# Patient Record
Sex: Female | Born: 1950 | Race: White | Hispanic: No | Marital: Married | Smoking: Former smoker
Health system: Southern US, Community
[De-identification: ages and names within clinical notes are randomized; demographics above are authoritative.]

## PROBLEM LIST (undated history)

## (undated) DIAGNOSIS — C50919 Malignant neoplasm of unspecified site of unspecified female breast: Secondary | ICD-10-CM

## (undated) DIAGNOSIS — J309 Allergic rhinitis, unspecified: Secondary | ICD-10-CM

## (undated) DIAGNOSIS — E119 Type 2 diabetes mellitus without complications: Secondary | ICD-10-CM

## (undated) DIAGNOSIS — K219 Gastro-esophageal reflux disease without esophagitis: Secondary | ICD-10-CM

## (undated) DIAGNOSIS — H1010 Acute atopic conjunctivitis, unspecified eye: Secondary | ICD-10-CM

## (undated) DIAGNOSIS — J45909 Unspecified asthma, uncomplicated: Secondary | ICD-10-CM

## (undated) DIAGNOSIS — C73 Malignant neoplasm of thyroid gland: Secondary | ICD-10-CM

## (undated) HISTORY — PX: PARTIAL HYSTERECTOMY: SHX80

## (undated) HISTORY — DX: Type 2 diabetes mellitus without complications: E11.9

## (undated) HISTORY — DX: Gastro-esophageal reflux disease without esophagitis: K21.9

## (undated) HISTORY — DX: Malignant neoplasm of thyroid gland: C73

## (undated) HISTORY — DX: Malignant neoplasm of unspecified site of unspecified female breast: C50.919

## (undated) HISTORY — DX: Acute atopic conjunctivitis, unspecified eye: H10.10

## (undated) HISTORY — DX: Unspecified asthma, uncomplicated: J45.909

## (undated) HISTORY — PX: THYROIDECTOMY: SHX17

## (undated) HISTORY — DX: Allergic rhinitis, unspecified: J30.9

## (undated) HISTORY — PX: BREAST LUMPECTOMY: SHX2

## (undated) HISTORY — PX: BRAIN TUMOR EXCISION: SHX577

---

## 2013-06-24 ENCOUNTER — Other Ambulatory Visit (HOSPITAL_COMMUNITY): Payer: Self-pay | Admitting: Internal Medicine

## 2013-06-24 DIAGNOSIS — C73 Malignant neoplasm of thyroid gland: Secondary | ICD-10-CM

## 2013-07-04 ENCOUNTER — Encounter (HOSPITAL_COMMUNITY)
Admission: RE | Admit: 2013-07-04 | Discharge: 2013-07-04 | Disposition: A | Discharge status: Home / Self Care | Payer: Self-pay | Source: Ambulatory Visit | Attending: Internal Medicine | Admitting: Internal Medicine

## 2013-07-04 DIAGNOSIS — C73 Malignant neoplasm of thyroid gland: Secondary | ICD-10-CM | POA: Insufficient documentation

## 2013-07-04 MED ORDER — THYROTROPIN ALFA 1.1 MG IM SOLR
0.9000 mg | INTRAMUSCULAR | Status: AC
  Administered 2013-07-04: 0.9 mg via INTRAMUSCULAR

## 2013-07-05 ENCOUNTER — Encounter (HOSPITAL_COMMUNITY)
Admission: RE | Admit: 2013-07-05 | Discharge: 2013-07-05 | Disposition: A | Discharge status: Home / Self Care | Payer: Self-pay | Source: Ambulatory Visit | Attending: Internal Medicine | Admitting: Internal Medicine

## 2013-07-05 MED ORDER — THYROTROPIN ALFA 1.1 MG IM SOLR
0.9000 mg | INTRAMUSCULAR | Status: AC
  Administered 2013-07-05: 0.9 mg via INTRAMUSCULAR

## 2013-07-06 ENCOUNTER — Encounter (HOSPITAL_COMMUNITY)
Admission: RE | Admit: 2013-07-06 | Discharge: 2013-07-06 | Disposition: A | Discharge status: Home / Self Care | Payer: Self-pay | Source: Ambulatory Visit | Attending: Internal Medicine | Admitting: Internal Medicine

## 2013-07-06 MED ORDER — SODIUM IODIDE I 131 CAPSULE
4.2000 | Freq: Once | INTRAVENOUS | Status: AC | PRN
  Administered 2013-07-06: 4.2 via ORAL

## 2013-07-08 ENCOUNTER — Encounter (HOSPITAL_COMMUNITY)
Admission: RE | Admit: 2013-07-08 | Discharge: 2013-07-08 | Disposition: A | Discharge status: Home / Self Care | Payer: Self-pay | Source: Ambulatory Visit | Attending: Internal Medicine | Admitting: Internal Medicine

## 2013-07-08 MED ORDER — SODIUM IODIDE I 131 CAPSULE: 4.0000 | Freq: Once | INTRAVENOUS | Status: AC | PRN

## 2015-09-03 DIAGNOSIS — J45909 Unspecified asthma, uncomplicated: Secondary | ICD-10-CM | POA: Insufficient documentation

## 2015-09-03 DIAGNOSIS — H1010 Acute atopic conjunctivitis, unspecified eye: Secondary | ICD-10-CM

## 2015-09-03 DIAGNOSIS — K219 Gastro-esophageal reflux disease without esophagitis: Secondary | ICD-10-CM | POA: Insufficient documentation

## 2015-09-03 DIAGNOSIS — J309 Allergic rhinitis, unspecified: Principal | ICD-10-CM

## 2015-09-03 DIAGNOSIS — J449 Chronic obstructive pulmonary disease, unspecified: Secondary | ICD-10-CM | POA: Insufficient documentation

## 2015-12-01 ENCOUNTER — Other Ambulatory Visit: Payer: Self-pay | Admitting: Allergy and Immunology

## 2016-01-24 ENCOUNTER — Other Ambulatory Visit: Payer: Self-pay | Admitting: Surgery

## 2016-01-24 DIAGNOSIS — R921 Mammographic calcification found on diagnostic imaging of breast: Secondary | ICD-10-CM

## 2016-01-24 DIAGNOSIS — Z853 Personal history of malignant neoplasm of breast: Secondary | ICD-10-CM

## 2016-01-24 DIAGNOSIS — R5381 Other malaise: Secondary | ICD-10-CM

## 2016-01-29 ENCOUNTER — Other Ambulatory Visit: Payer: Self-pay | Admitting: Allergy and Immunology

## 2016-01-31 ENCOUNTER — Ambulatory Visit
Admission: RE | Admit: 2016-01-31 | Discharge: 2016-01-31 | Disposition: A | Discharge status: Home / Self Care | Payer: Medicare Other | Source: Ambulatory Visit | Attending: Surgery | Admitting: Surgery

## 2016-01-31 DIAGNOSIS — Z853 Personal history of malignant neoplasm of breast: Secondary | ICD-10-CM

## 2016-01-31 DIAGNOSIS — R921 Mammographic calcification found on diagnostic imaging of breast: Secondary | ICD-10-CM

## 2016-02-26 ENCOUNTER — Other Ambulatory Visit: Payer: Self-pay | Admitting: Allergy and Immunology

## 2016-03-24 ENCOUNTER — Other Ambulatory Visit: Payer: Self-pay | Admitting: Allergy and Immunology

## 2016-03-31 ENCOUNTER — Other Ambulatory Visit: Payer: Self-pay | Admitting: Allergy and Immunology

## 2016-05-22 ENCOUNTER — Ambulatory Visit (INDEPENDENT_AMBULATORY_CARE_PROVIDER_SITE_OTHER): Payer: Medicare Other | Admitting: Allergy and Immunology

## 2016-05-22 ENCOUNTER — Encounter: Payer: Self-pay | Admitting: Allergy and Immunology

## 2016-05-22 VITALS — BP 130/72 | HR 60 | Resp 16 | Ht 63.98 in | Wt 185.8 lb

## 2016-05-22 DIAGNOSIS — J309 Allergic rhinitis, unspecified: Secondary | ICD-10-CM

## 2016-05-22 DIAGNOSIS — H1010 Acute atopic conjunctivitis, unspecified eye: Secondary | ICD-10-CM | POA: Diagnosis not present

## 2016-05-22 DIAGNOSIS — K219 Gastro-esophageal reflux disease without esophagitis: Secondary | ICD-10-CM | POA: Diagnosis not present

## 2016-05-22 DIAGNOSIS — J4489 Other specified chronic obstructive pulmonary disease: Secondary | ICD-10-CM

## 2016-05-22 DIAGNOSIS — J45909 Unspecified asthma, uncomplicated: Secondary | ICD-10-CM | POA: Diagnosis not present

## 2016-05-22 DIAGNOSIS — J449 Chronic obstructive pulmonary disease, unspecified: Secondary | ICD-10-CM | POA: Diagnosis not present

## 2016-05-22 MED ORDER — RANITIDINE HCL 300 MG PO TABS: 300.0000 mg | ORAL_TABLET | Freq: Every day | ORAL | Status: DC

## 2016-05-22 MED ORDER — ALBUTEROL SULFATE HFA 108 (90 BASE) MCG/ACT IN AERS: INHALATION_SPRAY | RESPIRATORY_TRACT | Status: DC

## 2016-05-22 NOTE — Progress Notes (Signed)
Follow-up Note  Referring Provider: Verdell Phillips., MD Primary Provider: Verdell Phillips., MD Date of Office Visit: 05/22/2016  Subjective:   Carlia Phillips (DOB: [birth year]) is a 65 y.o. 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 I've not seen her in his clinic in 1 year.  She has only had 1 exacerbation of her lung condition that required a Z-Pak and steroids this spring. Otherwise, she is gone almost one year with no exacerbations in very good control of her wheezing and coughing while consistently using her Symbicort and rarely using any short acting bronchodilator.  Likewise, her nose has been doing very well while using Flonase.  Her reflux been under very good control while she continues to consistently use ranitidine.    Medication List           calcitRIOL 0.5 MCG capsule  Commonly known as:  ROCALTROL  daily.     Calcium Carbonate-Vitamin D 600-400 MG-UNIT tablet  1,500 mg 2 (two) times daily.     cetirizine 10 MG tablet  Commonly known as:  ZYRTEC  Take 10 mg by mouth daily.     fluticasone 50 MCG/ACT nasal spray  Commonly known as:  FLONASE  Place 2 sprays into the nose daily.     hydrochlorothiazide 25 MG tablet  Commonly known as:  HYDRODIURIL     levothyroxine 175 MCG tablet  Commonly known as:  SYNTHROID, LEVOTHROID     MULTI-VITAMINS Tabs     potassium chloride 20 MEQ packet  Commonly known as:  KLOR-CON  Take by mouth.     PROAIR HFA 108 (90 Base) MCG/ACT inhaler  Generic drug:  albuterol  Inhale 2 puffs into the lungs every 4 (four) hours as needed for wheezing or shortness of breath.     ranitidine 300 MG tablet  Commonly known as:  ZANTAC  TAKE ONE TABLET BY MOUTH AT BEDTIME     SYMBICORT 160-4.5 MCG/ACT inhaler  Generic drug:  budesonide-formoterol  INHALE  2 PUFFS TWICE A DAY TO PREVENT COUGH AND WHEEZING. RINSE, GARGLE, AND SPIT AFTER USE.        Past Medical History  Diagnosis Date  . Thyroid cancer ([place])   . Breast cancer ([place])   . Asthma   . Allergic rhinoconjunctivitis   . GERD (gastroesophageal reflux disease)     Past Surgical History  Procedure Laterality Date  . Thyroidectomy    . Brain tumor excision    . Breast lumpectomy    . Partial hysterectomy      Allergies  Allergen Reactions  . Penicillins     Review of systems negative except as noted in HPI / PMHx or noted below:  Review of Systems  Constitutional: Negative.   HENT: Negative.   Eyes: Negative.   Respiratory: Negative.   Cardiovascular: Negative.   Gastrointestinal: Negative.   Genitourinary: Negative.   Musculoskeletal: Negative.   Skin: Negative.   Neurological: Negative.   Endo/Heme/Allergies: Negative.   Psychiatric/Behavioral: Negative.      Objective:   Filed Vitals:   05/22/16 1140  BP: 130/72  Pulse: 60  Resp: 16   Height: 5' 3.98" (162.5 cm)  Weight: 185 lb 12.8 oz (84.278 kg)   Physical Exam  Constitutional: She is well-developed, well-nourished, and in no distress.  HENT:  Head: Normocephalic.  Right Ear:  Tympanic membrane, external ear and ear canal normal.  Left Ear: Tympanic membrane, external ear and ear canal normal.  Nose: Nose normal. No mucosal edema or rhinorrhea.  Mouth/Throat: Uvula is midline, oropharynx is clear and moist and mucous membranes are normal. No oropharyngeal exudate.  Eyes: Conjunctivae are normal.  Neck: Trachea normal. No tracheal tenderness present. No tracheal deviation present. No thyromegaly present.  Cardiovascular: Normal rate, regular rhythm, S1 normal, S2 normal and normal heart sounds.   No murmur heard. Pulmonary/Chest: Breath sounds normal. No stridor. No respiratory distress. She has no wheezes. She has no rales.  Musculoskeletal: She exhibits no edema.  Lymphadenopathy:        Head (right side): No tonsillar adenopathy present.       Head (left side): No tonsillar adenopathy present.    She has no cervical adenopathy.  Neurological: She is alert. Gait normal.  Skin: No rash noted. She is not diaphoretic. No erythema. Nails show no clubbing.  Psychiatric: Mood and affect normal.    Diagnostics:    Spirometry was performed and demonstrated an FEV1 of 1.34 at 56 % of predicted.  The patient had an Asthma Control Test with the following results: ACT Total Score: 22.    Assessment and Plan:   1. COPD with asthma ([place])   2. Allergic rhinoconjunctivitis   3. Gastroesophageal reflux disease, esophagitis presence not specified     1. Continue Symbicort 160 - 2 inhalations twice a day  2. Continue nasal fluticasone 2 sprays each nostril one time per day  3. Continue ranitidine 300 mg one tablet one time per day  4. Continue cetirizine 10 mg one tablet one time per day  5. Continue ProAir HFA 2 puffs every 4-6 hours if needed  6. Obtain fall flu vaccine  7. Return to clinic in 6 months or earlier if problem.   Ann Phillips has done relatively well on her current medical plan regarding her respiratory tract disease and reflux and I see no need for changing this therapy at this point in time unless of course she develop significant problems as we move forward. I will see her back in this clinic in approximately 6 months or earlier if there is a problem. I've encouraged her to get the flu vaccine this fall.  Ann Katz, MD [place]

## 2016-05-22 NOTE — Patient Instructions (Addendum)
  1. Continue Symbicort 160 - 2 inhalations twice a day  2. Continue nasal fluticasone 2 sprays each nostril one time per day  3. Continue ranitidine 300 mg one tablet one time per day  4. Continue cetirizine 10 mg one tablet one time per day  5. Continue ProAir HFA 2 puffs every 4-6 hours if needed  6. Obtain fall flu vaccine  7. Return to clinic in 6 months or earlier if problem.

## 2016-09-19 ENCOUNTER — Other Ambulatory Visit: Payer: Self-pay | Admitting: Allergy and Immunology

## 2016-11-24 ENCOUNTER — Ambulatory Visit (INDEPENDENT_AMBULATORY_CARE_PROVIDER_SITE_OTHER): Payer: Medicare Other | Admitting: Allergy and Immunology

## 2016-11-24 ENCOUNTER — Encounter: Payer: Self-pay | Admitting: Allergy and Immunology

## 2016-11-24 VITALS — BP 134/68 | HR 76 | Resp 16

## 2016-11-24 DIAGNOSIS — K219 Gastro-esophageal reflux disease without esophagitis: Secondary | ICD-10-CM | POA: Diagnosis not present

## 2016-11-24 DIAGNOSIS — J3089 Other allergic rhinitis: Secondary | ICD-10-CM

## 2016-11-24 DIAGNOSIS — J449 Chronic obstructive pulmonary disease, unspecified: Secondary | ICD-10-CM | POA: Diagnosis not present

## 2016-11-24 DIAGNOSIS — Z23 Encounter for immunization: Secondary | ICD-10-CM | POA: Diagnosis not present

## 2016-11-24 MED ORDER — RANITIDINE HCL 300 MG PO TABS: ORAL_TABLET | ORAL | 1 refills | Status: DC

## 2016-11-24 NOTE — Patient Instructions (Addendum)
  1. Continue Symbicort 160 - 2 inhalations twice a day  2. Continue nasal fluticasone 2 sprays each nostril one time per day  3. Continue ranitidine 300 mg one tablet one time per day  4. Continue cetirizine 10 mg one tablet one time per day  5. Continue ProAir HFA 2 puffs every 4-6 hours if needed  6. Flu vaccine administered in clinic today  7. Return to clinic in 6 months or earlier if problem.

## 2016-11-24 NOTE — Progress Notes (Signed)
Follow-up Note  Referring Provider: Verdell Carmine., MD Primary Provider: Verdell Carmine., MD Date of Office Visit: 11/24/2016  Subjective:   Ann Phillips (DOB: 1951/02/08) is a 65 y.o. female who returns to the [facility] [place] on 11/24/2016 in re-evaluation of the following:  HPI: Ann Phillips returns to this clinic in reevaluation of her COPD with asthma, allergic rhinitis, and LPR. I've not seen her in his clinic since June 2017.  She has done quite well regarding her lower airways. She has not required a systemic steroid. She rarely uses any short acting bronchodilator. She can exert herself without any problem. She continues to consistently use her Symbicort.  Her nose has been doing well. She did contract a upper respiratory tract infection a few weeks ago which has for the most part resolved although she has a lingering cough. The cough is improving every week. She has not required an antibiotic since I've seen her in his clinic.  Her reflux is under very good control as his her throat issue while consistently using treatment for reflux.    Medication List      albuterol 108 (90 Base) MCG/ACT inhaler Commonly known as:  PROAIR HFA Inhale two puffs every four to six hours as needed for cough or wheeze.   calcitRIOL 0.5 MCG capsule Commonly known as:  ROCALTROL daily.   Calcium Carbonate-Vitamin D 600-400 MG-UNIT tablet 1,500 mg 2 (two) times daily.   cetirizine 10 MG tablet Commonly known as:  ZYRTEC Take 10 mg by mouth daily.   fluticasone 50 MCG/ACT nasal spray Commonly known as:  FLONASE Place 2 sprays into the nose daily.   hydrochlorothiazide 25 MG tablet Commonly known as:  HYDRODIURIL   levothyroxine 175 MCG tablet Commonly known as:  SYNTHROID, LEVOTHROID   potassium chloride 20 MEQ packet Commonly known as:  KLOR-CON Take by mouth.   ranitidine 300 MG tablet Commonly known as:  ZANTAC Take 1 tablet (300 mg total) by mouth at bedtime.     SYMBICORT 160-4.5 MCG/ACT inhaler Generic drug:  budesonide-formoterol INHALE TWO PUFFS BY MOUTH TWICE DAILY TO  PREVENT  COUGH  AND  WHEEZING.  RINSE  GARGLE  AND  SPIT  AFTER  USE       Past Medical History:  Diagnosis Date  . Allergic rhinoconjunctivitis   . Asthma   . Breast cancer ([place])   . GERD (gastroesophageal reflux disease)   . Thyroid cancer [facility])     Past Surgical History:  Procedure Laterality Date  . BRAIN TUMOR EXCISION    . BREAST LUMPECTOMY    . PARTIAL HYSTERECTOMY    . THYROIDECTOMY      Allergies  Allergen Reactions  . Penicillins     Review of systems negative except as noted in HPI / PMHx or noted below:  Review of Systems  Constitutional: Negative.   HENT: Negative.   Eyes: Negative.   Respiratory: Negative.   Cardiovascular: Negative.   Gastrointestinal: Negative.   Genitourinary: Negative.   Musculoskeletal: Negative.   Skin: Negative.   Neurological: Negative.   Endo/Heme/Allergies: Negative.   Psychiatric/Behavioral: Negative.      Objective:   Vitals:   11/24/16 1116  BP: 134/68  Pulse: 76  Resp: 16          Physical Exam  Constitutional: She is well-developed, well-nourished, and in no distress.  Raspy voice  HENT:  Head: Normocephalic.  Right Ear: Tympanic membrane, external ear and ear canal normal.  Left  Ear: Tympanic membrane, external ear and ear canal normal.  Nose: Nose normal. No mucosal edema or rhinorrhea.  Mouth/Throat: Uvula is midline, oropharynx is clear and moist and mucous membranes are normal. No oropharyngeal exudate.  Eyes: Conjunctivae are normal.  Neck: Trachea normal. No tracheal tenderness present. No tracheal deviation present. No thyromegaly present.  Cardiovascular: Normal rate, regular rhythm, S1 normal, S2 normal and normal heart sounds.   No murmur heard. Pulmonary/Chest: Breath sounds normal. No stridor. No respiratory distress. She has no wheezes. She has no rales.  Musculoskeletal:  She exhibits no edema.  Lymphadenopathy:       Head (right side): No tonsillar adenopathy present.       Head (left side): No tonsillar adenopathy present.    She has no cervical adenopathy.  Neurological: She is alert. Gait normal.  Skin: No rash noted. She is not diaphoretic. No erythema. Nails show no clubbing.  Psychiatric: Mood and affect normal.    Diagnostics:    Spirometry was performed and demonstrated an FEV1 of 1.50 at 63 % of predicted.  The patient had an Asthma Control Test with the following results: ACT Total Score: 24.    Assessment and Plan:   1. COPD with asthma ([place])   2. Other allergic rhinitis   3. Gastroesophageal reflux disease, esophagitis presence not specified   4. Need for prophylactic vaccination and inoculation against influenza     1. Continue Symbicort 160 - 2 inhalations twice a day  2. Continue nasal fluticasone 2 sprays each nostril one time per day  3. Continue ranitidine 300 mg one tablet one time per day  4. Continue cetirizine 10 mg one tablet one time per day  5. Continue ProAir HFA 2 puffs every 4-6 hours if needed  6. Flu vaccine administered in clinic today  7. Return to clinic in 6 months or earlier if problem.   Ann Phillips appears to be doing relatively well on her current plan and we'll continue to have her use anti-inflammatory medications for her respiratory tract and also for her reflux. She was administered the flu vaccine today. I will see her back in this clinic in 6 months or earlier if there is a problem.  [name], MD [place]

## 2017-02-02 ENCOUNTER — Ambulatory Visit: Payer: Medicare Other | Admitting: Allergy and Immunology

## 2017-05-17 ENCOUNTER — Other Ambulatory Visit: Payer: Self-pay | Admitting: Allergy and Immunology

## 2017-05-25 ENCOUNTER — Ambulatory Visit (INDEPENDENT_AMBULATORY_CARE_PROVIDER_SITE_OTHER): Payer: Medicare Other | Admitting: Allergy and Immunology

## 2017-05-25 ENCOUNTER — Encounter: Payer: Self-pay | Admitting: Allergy and Immunology

## 2017-05-25 VITALS — BP 120/64 | HR 82 | Resp 16

## 2017-05-25 DIAGNOSIS — K219 Gastro-esophageal reflux disease without esophagitis: Secondary | ICD-10-CM | POA: Diagnosis not present

## 2017-05-25 DIAGNOSIS — J3089 Other allergic rhinitis: Secondary | ICD-10-CM

## 2017-05-25 DIAGNOSIS — J449 Chronic obstructive pulmonary disease, unspecified: Secondary | ICD-10-CM | POA: Diagnosis not present

## 2017-05-25 NOTE — Progress Notes (Signed)
Follow-up Note  Referring Provider: Verdell Carmine., MD Primary Provider: Verdell Carmine., MD Date of Office Visit: 05/25/2017  Subjective:   Ann Phillips (DOB: December 22, 1950) is a 66 y.o. 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 Her last visit to this clinic was December 2017.  She did require a systemic steroid in February for a "chest congestion" event that appeared to not be associated with a fever or other respiratory tract symptoms. Apparently she had coughing and wheezing and congestion for about one week. Other than that event she has really done very well and rarely uses a short acting bronchodilator and can exert herself without any problem and has not required any additional steroids.  Her nose has really been doing quite well and she has not required an antibiotic to treat an episode of sinusitis. She has no problems with reflux and no problems with her throat.  In May she was treated with prednisone for an episode of hives. There was no associated systemic or constitutional symptoms or obvious trigger. Her skin lesions never healed with scar or hyperpigmentation. Apparently this occurred after exposure to a room at church but there was no obvious precipitant contributing to that issue.  Allergies as of 05/25/2017      Reactions   Penicillins       Medication List      albuterol 108 (90 Base) MCG/ACT inhaler Commonly known as:  PROAIR HFA Inhale two puffs every four to six hours as needed for cough or wheeze.   calcitRIOL 0.5 MCG capsule Commonly known as:  ROCALTROL daily.   Calcium Carbonate-Vitamin D 600-400 MG-UNIT tablet 1,500 mg 2 (two) times daily.   cetirizine 10 MG tablet Commonly known as:  ZYRTEC Take 10 mg by mouth daily.   fluticasone 50 MCG/ACT nasal  spray Commonly known as:  FLONASE Place 2 sprays into the nose daily.   hydrochlorothiazide 25 MG tablet Commonly known as:  HYDRODIURIL   KLOR-CON M20 20 MEQ tablet Generic drug:  potassium chloride SA   levothyroxine 175 MCG tablet Commonly known as:  SYNTHROID, LEVOTHROID   potassium chloride 20 MEQ packet Commonly known as:  KLOR-CON Take by mouth.   ranitidine 300 MG tablet Commonly known as:  ZANTAC TAKE ONE TABLET BY MOUTH ONCE DAILY AS DIRECTED   SYMBICORT 160-4.5 MCG/ACT inhaler Generic drug:  budesonide-formoterol INHALE TWO PUFFS BY MOUTH TWICE DAILY TO  PREVENT  COUGH  AND  WHEEZING.  RINSE  GARGLE  AND  SPIT  AFTER  USE       Past Medical History:  Diagnosis Date  . Allergic rhinoconjunctivitis   . Asthma   . Breast cancer ([place])   . GERD (gastroesophageal reflux disease)   . Thyroid cancer [facility])     Past Surgical History:  Procedure Laterality Date  . BRAIN TUMOR EXCISION    . BREAST LUMPECTOMY    . PARTIAL HYSTERECTOMY    . THYROIDECTOMY      Review of systems negative except as noted in HPI / PMHx or noted below:  Review of Systems  Constitutional: Negative.   HENT: Negative.   Eyes: Negative.   Respiratory: Negative.   Cardiovascular: Negative.   Gastrointestinal: Negative.   Genitourinary: Negative.   Musculoskeletal: Negative.   Skin: Negative.   Neurological: Negative.  Endo/Heme/Allergies: Negative.   Psychiatric/Behavioral: Negative.      Objective:   Vitals:   05/25/17 1107  BP: 120/64  Pulse: 82  Resp: 16          Physical Exam  Constitutional: She is well-developed, well-nourished, and in no distress.  HENT:  Head: Normocephalic.  Right Ear: Tympanic membrane, external ear and ear canal normal.  Left Ear: Tympanic membrane, external ear and ear canal normal.  Nose: Nose normal. No mucosal edema or rhinorrhea.  Mouth/Throat: Uvula is midline, oropharynx is clear and moist and mucous membranes are normal. No  oropharyngeal exudate.  Eyes: Conjunctivae are normal.  Neck: Trachea normal. No tracheal tenderness present. No tracheal deviation present. No thyromegaly present.  Cardiovascular: Normal rate, regular rhythm, S1 normal, S2 normal and normal heart sounds.   No murmur heard. Pulmonary/Chest: Breath sounds normal. No stridor. No respiratory distress. She has no wheezes. She has no rales.  Musculoskeletal: She exhibits no edema.  Lymphadenopathy:       Head (right side): No tonsillar adenopathy present.       Head (left side): No tonsillar adenopathy present.    She has no cervical adenopathy.  Neurological: She is alert. Gait normal.  Skin: No rash noted. She is not diaphoretic. No erythema. Nails show no clubbing.  Psychiatric: Mood and affect normal.    Diagnostics:    Spirometry was performed and demonstrated an FEV1 of 1.28 at 54 % of predicted.  The patient had an Asthma Control Test with the following results: ACT Total Score: 25.    Assessment and Plan:   1. COPD with asthma ([place])   2. Other allergic rhinitis   3. Gastroesophageal reflux disease, esophagitis presence not specified     1. Continue Symbicort 160 - 2 inhalations twice a day  2. Continue nasal fluticasone 2 sprays each nostril one time per day  3. Continue ranitidine 300 mg one tablet one time per day  4. Continue cetirizine 10 mg one tablet one time per day  5. Continue ProAir HFA 2 puffs every 4-6 hours if needed  6. Return to clinic in 6 months or earlier if problem.   Ann Phillips appears to be doing relatively well regarding her respiratory tract and I see no need for changing her medical therapy at this point in time. Likewise, she will remain on therapy for reflux.The etiology of her isolated episode of urticaria is unknown but at this point in time she will not require any further evaluation or treatment unless of course this is a recurrent event. I will see her back in this clinic in 6 months or earlier  if there is a problem. I did inform her that she needs to obtain a flu vaccine this fall.  Ann Katz, MD Allergy / Immunology [place]

## 2017-05-25 NOTE — Patient Instructions (Signed)
  1. Continue Symbicort 160 - 2 inhalations twice a day  2. Continue nasal fluticasone 2 sprays each nostril one time per day  3. Continue ranitidine 300 mg one tablet one time per day  4. Continue cetirizine 10 mg one tablet one time per day  5. Continue ProAir HFA 2 puffs every 4-6 hours if needed  6. Return to clinic in 6 months or earlier if problem.

## 2017-05-28 ENCOUNTER — Other Ambulatory Visit: Payer: Self-pay | Admitting: Allergy and Immunology

## 2017-06-15 ENCOUNTER — Other Ambulatory Visit: Payer: Self-pay | Admitting: Allergy and Immunology

## 2017-09-14 ENCOUNTER — Other Ambulatory Visit: Payer: Self-pay | Admitting: Allergy and Immunology

## 2017-11-15 ENCOUNTER — Other Ambulatory Visit: Payer: Self-pay | Admitting: Allergy and Immunology

## 2017-11-23 ENCOUNTER — Ambulatory Visit: Payer: Medicare Other | Admitting: Allergy and Immunology

## 2017-12-16 ENCOUNTER — Other Ambulatory Visit: Payer: Self-pay

## 2017-12-16 ENCOUNTER — Other Ambulatory Visit: Payer: Self-pay | Admitting: Allergy and Immunology

## 2017-12-16 MED ORDER — RANITIDINE HCL 300 MG PO TABS: 300.0000 mg | ORAL_TABLET | Freq: Every day | ORAL | 0 refills | Status: DC

## 2017-12-16 NOTE — Telephone Encounter (Signed)
RX for Ranitidine sent into Pt's pharmacy, Pt is due for OV.

## 2017-12-17 ENCOUNTER — Ambulatory Visit: Payer: Medicare Other | Admitting: Allergy

## 2018-01-28 ENCOUNTER — Ambulatory Visit: Payer: Self-pay | Admitting: Allergy and Immunology

## 2018-01-28 ENCOUNTER — Encounter: Payer: Self-pay | Admitting: Allergy and Immunology

## 2018-01-28 ENCOUNTER — Ambulatory Visit (INDEPENDENT_AMBULATORY_CARE_PROVIDER_SITE_OTHER): Payer: Medicare Other | Admitting: Allergy and Immunology

## 2018-01-28 VITALS — BP 138/68 | HR 76 | Resp 20

## 2018-01-28 DIAGNOSIS — J3089 Other allergic rhinitis: Secondary | ICD-10-CM

## 2018-01-28 DIAGNOSIS — K219 Gastro-esophageal reflux disease without esophagitis: Secondary | ICD-10-CM | POA: Diagnosis not present

## 2018-01-28 DIAGNOSIS — J449 Chronic obstructive pulmonary disease, unspecified: Secondary | ICD-10-CM | POA: Diagnosis not present

## 2018-01-28 MED ORDER — ALBUTEROL SULFATE HFA 108 (90 BASE) MCG/ACT IN AERS: INHALATION_SPRAY | RESPIRATORY_TRACT | 1 refills | Status: DC

## 2018-01-28 MED ORDER — FLUTICASONE PROPIONATE 50 MCG/ACT NA SUSP: 2.0000 | Freq: Every day | NASAL | 3 refills | Status: DC

## 2018-01-28 MED ORDER — BUDESONIDE-FORMOTEROL FUMARATE 160-4.5 MCG/ACT IN AERO: INHALATION_SPRAY | RESPIRATORY_TRACT | 3 refills | Status: DC

## 2018-01-28 MED ORDER — RANITIDINE HCL 300 MG PO TABS: 300.0000 mg | ORAL_TABLET | Freq: Every day | ORAL | 3 refills | Status: DC

## 2018-01-28 NOTE — Progress Notes (Signed)
Follow-up Note  Referring Provider: Verdell Carmine., MD Primary Provider: Verdell Carmine., MD Date of Office Visit: 01/28/2018  Subjective:   Ann Phillips (DOB: 1951/02/19) is a 67 y.o. 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  Her last visit to this clinic was 25 May 2017.  She has been doing very well with her respiratory tract and has not required a systemic steroid or an antibiotic to treat any type of respiratory tract issue.  She rarely uses a short acting bronchodilator.  She is slowly tapered off her Symbicort and has not been using this medication since October.  She would like to just use this medication whenever she needs it.  Likewise, she has tapered off her Flonase and she also wants to use this medication just when needed.  Her reflux is under good control as has the issue with her throat.  She continues on ranitidine.  She did obtain the flu vaccine this year.  Allergies as of 01/28/2018      Reactions   Penicillins       Medication List      albuterol 108 (90 Base) MCG/ACT inhaler Commonly known as:  PROAIR HFA Inhale two puffs every four to six hours as needed for cough or wheeze.   calcitRIOL 0.5 MCG capsule Commonly known as:  ROCALTROL daily.   Calcium Carbonate-Vitamin D 600-400 MG-UNIT tablet 1,500 mg 2 (two) times daily.   cetirizine 10 MG tablet Commonly known as:  ZYRTEC Take 10 mg by mouth daily.   fluticasone 50 MCG/ACT nasal spray Commonly known as:  FLONASE Place 2 sprays into the nose daily.   hydrochlorothiazide 25 MG tablet Commonly known as:  HYDRODIURIL   KLOR-CON M20 20 MEQ tablet Generic drug:  potassium chloride SA   levothyroxine 175 MCG tablet Commonly known as:  SYNTHROID, LEVOTHROID   ranitidine 300 MG tablet Commonly known as:  ZANTAC Take 1 tablet  (300 mg total) by mouth daily. as directed   SYMBICORT 160-4.5 MCG/ACT inhaler Generic drug:  budesonide-formoterol INHALE TWO PUFFS BY MOUTH TWICE DAILY TO  PREVENT  COUGH  AND  WHEEZING  -  RINSE  GARGLE  AND  SPIT  AFTER  USE       Past Medical History:  Diagnosis Date  . Allergic rhinoconjunctivitis   . Asthma   . Breast cancer ([place])   . GERD (gastroesophageal reflux disease)   . Thyroid cancer [facility])     Past Surgical History:  Procedure Laterality Date  . BRAIN TUMOR EXCISION    . BREAST LUMPECTOMY    . PARTIAL HYSTERECTOMY    . THYROIDECTOMY      Review of systems negative except as noted in HPI / PMHx or noted below:  Review of Systems  Constitutional: Negative.   HENT: Negative.   Eyes: Negative.   Respiratory: Negative.   Cardiovascular: Negative.   Gastrointestinal: Negative.   Genitourinary: Negative.   Musculoskeletal: Negative.   Skin: Negative.   Neurological: Negative.   Endo/Heme/Allergies: Negative.   Psychiatric/Behavioral: Negative.      Objective:   Vitals:   01/28/18 1514  BP: 138/68  Pulse: 76  Resp: 20          Physical Exam  Constitutional: She is well-developed, well-nourished, and in no distress.  Raspy voice  HENT:  Head:  Normocephalic.  Right Ear: Tympanic membrane, external ear and ear canal normal.  Left Ear: Tympanic membrane, external ear and ear canal normal.  Nose: Nose normal. No mucosal edema or rhinorrhea.  Mouth/Throat: Uvula is midline, oropharynx is clear and moist and mucous membranes are normal. No oropharyngeal exudate.  Eyes: Conjunctivae are normal.  Neck: Trachea normal. No tracheal tenderness present. No tracheal deviation present. No thyromegaly present.  Cardiovascular: Normal rate, regular rhythm, S1 normal, S2 normal and normal heart sounds.  No murmur heard. Pulmonary/Chest: Breath sounds normal. No stridor. No respiratory distress. She has no wheezes. She has no rales.  Musculoskeletal: She  exhibits no edema.  Lymphadenopathy:       Head (right side): No tonsillar adenopathy present.       Head (left side): No tonsillar adenopathy present.    She has no cervical adenopathy.  Neurological: She is alert. Gait normal.  Skin: No rash noted. She is not diaphoretic. No erythema. Nails show no clubbing.  Psychiatric: Mood and affect normal.    Diagnostics:    Spirometry was performed and demonstrated an FEV1 of 1.20 at 47 % of predicted.  The patient had an Asthma Control Test with the following results: ACT Total Score: 23.    Assessment and Plan:   1. COPD with asthma ([place])   2. Other allergic rhinitis   3. Gastroesophageal reflux disease, esophagitis presence not specified     1. Continue Symbicort 160 - 2 inhalations twice a day during periods of lower airway symptoms  2. Continue nasal fluticasone 2 sprays each nostril one time per day during periods of upper airway symptoms  3. Continue ranitidine 300 mg one tablet one time per day  4. Continue cetirizine 10 mg one tablet one time per day  5. Continue ProAir HFA 2 puffs every 4-6 hours if needed  6. Return to clinic in 6 months or earlier if problem.   Overall Ann Phillips is doing quite well.  She would like to only use her medications as needed.  I did have a talk with her today about the preventative nature of these medications and how there is a delay in acquiring that prevention.  She appears to understand this concept and she may use her medications on a more regular basis that her current use.  I will see her back in this clinic in 6 months or earlier if there is a problem.  Ann Katz, MD Allergy / Immunology [place]

## 2018-01-28 NOTE — Patient Instructions (Addendum)
  1. Continue Symbicort 160 - 2 inhalations twice a day during periods of lower airway symptoms  2. Continue nasal fluticasone 2 sprays each nostril one time per day during periods of upper airway symptoms  3. Continue ranitidine 300 mg one tablet one time per day  4. Continue cetirizine 10 mg one tablet one time per day  5. Continue ProAir HFA 2 puffs every 4-6 hours if needed  6. Return to clinic in 6 months or earlier if problem.

## 2018-02-01 ENCOUNTER — Encounter: Payer: Self-pay | Admitting: Allergy and Immunology

## 2018-07-29 ENCOUNTER — Encounter: Payer: Self-pay | Admitting: Allergy and Immunology

## 2018-07-29 ENCOUNTER — Ambulatory Visit: Payer: Medicare Other | Admitting: Allergy and Immunology

## 2018-07-29 VITALS — BP 140/60 | HR 71 | Resp 18

## 2018-07-29 DIAGNOSIS — J3089 Other allergic rhinitis: Secondary | ICD-10-CM | POA: Diagnosis not present

## 2018-07-29 DIAGNOSIS — K219 Gastro-esophageal reflux disease without esophagitis: Secondary | ICD-10-CM | POA: Diagnosis not present

## 2018-07-29 DIAGNOSIS — J449 Chronic obstructive pulmonary disease, unspecified: Secondary | ICD-10-CM

## 2018-07-29 NOTE — Patient Instructions (Addendum)
  1. Continue Symbicort 160 - 2 inhalations twice a day during periods of lower airway symptoms  2. Continue nasal fluticasone 2 sprays each nostril one time per day during periods of upper airway symptoms  3. Continue ranitidine 300 mg one tablet one time per day  4. Continue cetirizine 10 mg one tablet one time per day  5. Continue ProAir HFA 2 puffs every 4-6 hours if needed  6. Return to clinic in 6 months or earlier if problem.   7. Obtain fall flu vaccine

## 2018-07-29 NOTE — Progress Notes (Signed)
Follow-up Note  Referring Provider: Verdell Carmine., MD Primary Provider: Verdell Carmine., MD Date of Office Visit: 07/29/2018  Subjective:   Ann Phillips (DOB: Oct 20, 1951) is a 67 y.o. female who returns to the All[facility] and [place] on 07/29/2018 in re-evaluation of the following:  HPI: Ann Phillips returns to this clinic in reevaluation of COPD with asthma, allergic rhinitis, and LPR.  Her last visit to this clinic was for February 2019.  She has really had an excellent interval of time without the need for systemic steroid or antibiotic for any type of respiratory tract issue.  She is not using her Symbicort on a regular basis but she is using her nasal fluticasone daily.  She continues to utilize a antihistamine as well.  Rarely does she use a short acting bronchodilator.  Her reflux is under excellent control with consistent use of an H2 receptor blocker.  Allergies as of 07/29/2018      Reactions   Penicillins       Medication List      albuterol 108 (90 Base) MCG/ACT inhaler Commonly known as:  PROVENTIL HFA;VENTOLIN HFA Inhale two puffs every four to six hours as needed for cough or wheeze.   budesonide-formoterol 160-4.5 MCG/ACT inhaler Commonly known as:  SYMBICORT INHALE TWO PUFFS BY MOUTH TWICE DAILY TO  PREVENT  COUGH  AND  WHEEZING  -  RINSE  GARGLE  AND  SPIT  AFTER  USE   calcitRIOL 0.5 MCG capsule Commonly known as:  ROCALTROL daily.   Calcium Carbonate-Vitamin D 600-400 MG-UNIT tablet 1,500 mg 2 (two) times daily.   cetirizine 10 MG tablet Commonly known as:  ZYRTEC Take 10 mg by mouth daily.   fluticasone 50 MCG/ACT nasal spray Commonly known as:  FLONASE Place 2 sprays into both nostrils daily.   hydrochlorothiazide 25 MG tablet Commonly known as:  HYDRODIURIL   KLOR-CON M20 20 MEQ tablet Generic drug:  potassium chloride SA   levothyroxine 175 MCG tablet Commonly known as:  SYNTHROID, LEVOTHROID   ranitidine 300 MG tablet Commonly  known as:  ZANTAC Take 1 tablet (300 mg total) by mouth daily. as directed       Past Medical History:  Diagnosis Date  . Allergic rhinoconjunctivitis   . Asthma   . Breast cancer ([place])   . GERD (gastroesophageal reflux disease)   . Thyroid cancer [facility])     Past Surgical History:  Procedure Laterality Date  . BRAIN TUMOR EXCISION    . BREAST LUMPECTOMY    . PARTIAL HYSTERECTOMY    . THYROIDECTOMY      Review of systems negative except as noted in HPI / PMHx or noted below:  Review of Systems  Constitutional: Negative.   HENT: Negative.   Eyes: Negative.   Respiratory: Negative.   Cardiovascular: Negative.   Gastrointestinal: Negative.   Genitourinary: Negative.   Musculoskeletal: Negative.   Skin: Negative.   Neurological: Negative.   Endo/Heme/Allergies: Negative.   Psychiatric/Behavioral: Negative.      Objective:   Vitals:   07/29/18 1509  BP: 140/60  Pulse: 71  Resp: 18  SpO2: 96%          Physical Exam  HENT:  Head: Normocephalic.  Right Ear: Tympanic membrane, external ear and ear canal normal.  Left Ear: Tympanic membrane, external ear and ear canal normal.  Nose: Nose normal. No mucosal edema or rhinorrhea.  Mouth/Throat: Uvula is midline, oropharynx is clear and moist and mucous membranes are  normal. No oropharyngeal exudate.  Eyes: Conjunctivae are normal.  Neck: Trachea normal. No tracheal tenderness present. No tracheal deviation present. No thyromegaly present.  Cardiovascular: Normal rate, regular rhythm, S1 normal, S2 normal and normal heart sounds.  No murmur heard. Pulmonary/Chest: Breath sounds normal. No stridor. No respiratory distress. She has no wheezes. She has no rales.  Musculoskeletal: She exhibits no edema.  Lymphadenopathy:       Head (right side): No tonsillar adenopathy present.       Head (left side): No tonsillar adenopathy present.    She has no cervical adenopathy.  Neurological: She is alert.  Skin: No rash  noted. She is not diaphoretic. No erythema. Nails show no clubbing.    Diagnostics:    Spirometry was performed and demonstrated an FEV1 of 1.12 at 44 % of predicted.  Assessment and Plan:   1. COPD with asthma ([place])   2. Other allergic rhinitis   3. Gastroesophageal reflux disease, esophagitis presence not specified     1. Continue Symbicort 160 - 2 inhalations twice a day during periods of lower airway symptoms  2. Continue nasal fluticasone 2 sprays each nostril one time per day during periods of upper airway symptoms  3. Continue ranitidine 300 mg one tablet one time per day  4. Continue cetirizine 10 mg one tablet one time per day  5. Continue ProAir HFA 2 puffs every 4-6 hours if needed  6. Return to clinic in 6 months or earlier if problem.   7. Obtain fall flu vaccine  Ann Phillips appears to be doing very well at this point in time.  She will continue to utilize an H1 and H2 receptor blocker and a nasal steroid on a regular basis to address her atopic and reflux disease.  I do not think that she needs to consistently use Symbicort on a consistent basis as she has really done so well over the course of the past year without the need for a controller agent.  I will see her back in his clinic in 6 months or earlier if there is a problem.    Ann Katz, MD Allergy / Immunology [place]

## 2018-08-02 ENCOUNTER — Encounter: Payer: Self-pay | Admitting: Allergy and Immunology

## 2018-11-15 ENCOUNTER — Telehealth: Payer: Self-pay | Admitting: Allergy and Immunology

## 2018-11-15 NOTE — Telephone Encounter (Signed)
Patient just filled her 90 supply of ranitidine and realized it had been recalled. She would like to know what Dr. Neldon Mc will replace it with and to please send that in.

## 2018-11-15 NOTE — Telephone Encounter (Signed)
Called patient and advised that per Dr Neldon Mc the FDA has not recalled meds so she can continue what she has.  She did advisee that pharmacy stated would not be able to fill next time so I advised her to cal [place] then and we can change her to pepcid 40mg 

## 2019-01-26 ENCOUNTER — Other Ambulatory Visit: Payer: Self-pay | Admitting: Allergy and Immunology

## 2019-01-31 ENCOUNTER — Ambulatory Visit: Payer: Medicare Other | Admitting: Allergy and Immunology

## 2019-01-31 DIAGNOSIS — J301 Allergic rhinitis due to pollen: Secondary | ICD-10-CM

## 2019-02-28 ENCOUNTER — Encounter: Payer: Self-pay | Admitting: Allergy and Immunology

## 2019-02-28 ENCOUNTER — Ambulatory Visit: Payer: Medicare Other | Admitting: Allergy and Immunology

## 2019-02-28 ENCOUNTER — Other Ambulatory Visit: Payer: Self-pay

## 2019-02-28 VITALS — BP 144/70 | HR 74 | Resp 16

## 2019-02-28 DIAGNOSIS — J449 Chronic obstructive pulmonary disease, unspecified: Secondary | ICD-10-CM

## 2019-02-28 DIAGNOSIS — K219 Gastro-esophageal reflux disease without esophagitis: Secondary | ICD-10-CM | POA: Diagnosis not present

## 2019-02-28 DIAGNOSIS — J3089 Other allergic rhinitis: Secondary | ICD-10-CM | POA: Diagnosis not present

## 2019-02-28 MED ORDER — BUDESONIDE-FORMOTEROL FUMARATE 160-4.5 MCG/ACT IN AERO: INHALATION_SPRAY | RESPIRATORY_TRACT | 5 refills | Status: DC

## 2019-02-28 MED ORDER — FLUTICASONE PROPIONATE 50 MCG/ACT NA SUSP: 2.0000 | Freq: Every day | NASAL | 3 refills | Status: DC

## 2019-02-28 MED ORDER — FAMOTIDINE 40 MG PO TABS: ORAL_TABLET | ORAL | 5 refills | Status: DC

## 2019-02-28 NOTE — Patient Instructions (Signed)
  1. Continue Symbicort 160 - 2 inhalations twice a day during periods of lower airway symptoms  2. Continue nasal fluticasone 2 sprays each nostril one time per day during periods of upper airway symptoms  3. Continue famotidine 40mg  one tablet one time per day  4. Continue cetirizine 10 mg one tablet one time per day  5. Continue ProAir HFA 2 puffs every 4-6 hours if needed  6. Return to clinic in 6 months or earlier if problem.

## 2019-02-28 NOTE — Progress Notes (Signed)
[place] [facility] [place]   Follow-up Note  Referring Provider: Verdell Carmine., MD Primary Provider: Verdell Carmine., MD Date of Office Visit: 02/28/2019  Subjective:   Ann Phillips (DOB: 1951/02/21) is a 68 y.o. female who returns to the Allergy and [place] on 02/28/2019 in re-evaluation of the following:  HPI: [name] presents to this clinic in reevaluation of COPD with asthma, allergic rhinitis, and LPR.  Her last visit to this clinic was 29 July 2018.  During the interval she has done very well and has not required a systemic steroid or an antibiotic to treat any type of respiratory tract issue and rarely uses a short acting bronchodilator and can exert herself without any problem.  She continues to use Symbicort on a regular basis.  She has not been having any problems regarding her upper airways while intermittently using a nasal steroid.  She discontinued her ranitidine and has been having problems with heartburn.  In the past she was intolerant of proton pump inhibitors because of "gas".  She did obtain the flu vaccine.  Allergies as of 02/28/2019      Reactions   Penicillins       Medication List      albuterol 108 (90 Base) MCG/ACT inhaler Commonly known as:  ProAir HFA Inhale two puffs every four to six hours as needed for cough or wheeze.   calcitRIOL 0.5 MCG capsule Commonly known as:  ROCALTROL daily.   Calcium Carbonate-Vitamin D 600-400 MG-UNIT tablet 1,500 mg 2 (two) times daily.   cetirizine 10 MG tablet Commonly known as:  ZYRTEC Take 10 mg by mouth daily.   fluticasone 50 MCG/ACT nasal spray Commonly known as:  FLONASE Place 2 sprays into both nostrils daily.   hydrochlorothiazide 25 MG tablet Commonly known as:  HYDRODIURIL   Klor-Con M20 20 MEQ tablet Generic drug:  potassium chloride SA   levothyroxine 175 MCG tablet Commonly known as:  SYNTHROID, LEVOTHROID       Past Medical History:   Diagnosis Date  . Allergic rhinoconjunctivitis   . Asthma   . Breast cancer ([place])   . GERD (gastroesophageal reflux disease)   . Thyroid cancer [facility])     Past Surgical History:  Procedure Laterality Date  . BRAIN TUMOR EXCISION    . BREAST LUMPECTOMY    . PARTIAL HYSTERECTOMY    . THYROIDECTOMY      Review of systems negative except as noted in HPI / PMHx or noted below:  Review of Systems  Constitutional: Negative.   HENT: Negative.   Eyes: Negative.   Respiratory: Negative.   Cardiovascular: Negative.   Gastrointestinal: Negative.   Genitourinary: Negative.   Musculoskeletal: Negative.   Skin: Negative.   Neurological: Negative.   Endo/Heme/Allergies: Negative.   Psychiatric/Behavioral: Negative.      Objective:   Vitals:   02/28/19 1122  BP: (!) 144/70  Pulse: 74  Resp: 16  SpO2: 96%          Physical Exam  HENT:  Head: Normocephalic.  Right Ear: Tympanic membrane, external ear and ear canal normal.  Left Ear: Tympanic membrane, external ear and ear canal normal.  Nose: Nose normal. No mucosal edema or rhinorrhea.  Mouth/Throat: Uvula is midline, oropharynx is clear and moist and mucous membranes are normal. No oropharyngeal exudate.  Eyes: Conjunctivae are normal.  Neck: Trachea normal. No tracheal tenderness present. No tracheal deviation present. No thyromegaly present.  Cardiovascular: Normal rate,  regular rhythm, S1 normal, S2 normal and normal heart sounds.  No murmur heard. Pulmonary/Chest: Breath sounds normal. No stridor. No respiratory distress. She has no wheezes. She has no rales.  Musculoskeletal: She exhibits no edema.  Lymphadenopathy:       Head (right side): No tonsillar adenopathy present.       Head (left side): No tonsillar adenopathy present.    She has no cervical adenopathy.  Neurological: She is alert.  Skin: No rash noted. She is not diaphoretic. No erythema. Nails show no clubbing.   Diagnostics:    Spirometry was  performed and demonstrated an FEV1 of 1.10 at 48 % of predicted.  Assessment and Plan:   1. COPD with asthma ([place])   2. Other allergic rhinitis   3. Gastroesophageal reflux disease, esophagitis presence not specified     1. Continue Symbicort 160 - 2 inhalations twice a day during periods of lower airway symptoms  2. Continue nasal fluticasone 2 sprays each nostril one time per day during periods of upper airway symptoms  3. Continue famotidine 40mg  one tablet one time per day  4. Continue cetirizine 10 mg one tablet one time per day  5. Continue ProAir HFA 2 puffs every 4-6 hours if needed  6. Return to clinic in 6 months or earlier if problem.   Overall [name] appears to be doing quite well on her current plan and she will remain on anti-inflammatory agents for her airway and we will continue her on a H2 receptor blocker for her reflux.  We will need to change her ranitidine to famotidine at this point in time based upon availability of ranitidine.  I will see her back in this clinic in 6 months or earlier if there is a problem.  [name], MD Allergy / Immunology [place]

## 2019-03-01 ENCOUNTER — Encounter: Payer: Self-pay | Admitting: Allergy and Immunology

## 2019-05-25 ENCOUNTER — Telehealth: Payer: Self-pay | Admitting: Allergy and Immunology

## 2019-05-25 NOTE — Telephone Encounter (Signed)
Ann Phillips states that she will try to get generic Pepcid OTC. I told her to call us back if she has issues finding it.

## 2019-05-25 NOTE — Telephone Encounter (Signed)
Please inform patient that in the past she was intolerant of proton pump inhibitors because of "gas".  We can certainly try Prevacid at 15 mg daily or she can try a different pharmacy to see if famotidine is available.

## 2019-05-25 NOTE — Telephone Encounter (Signed)
Ann Phillips called in and states [name] has Pepcid on back order and they don't know when they will get anymore in.  The pharmacist suggested Prevacid to Ann Phillips.  Ann Phillips would like to know if she could get a 90 day supply of Prevacid called in to the [facility] in [place].  Please advise.

## 2019-08-31 ENCOUNTER — Ambulatory Visit: Payer: Medicare Other | Admitting: Allergy and Immunology

## 2019-08-31 ENCOUNTER — Encounter: Payer: Self-pay | Admitting: Allergy and Immunology

## 2019-08-31 ENCOUNTER — Other Ambulatory Visit: Payer: Self-pay

## 2019-08-31 VITALS — BP 140/60 | HR 76 | Resp 20 | Ht 63.9 in | Wt 189.4 lb

## 2019-08-31 DIAGNOSIS — J449 Chronic obstructive pulmonary disease, unspecified: Secondary | ICD-10-CM | POA: Diagnosis not present

## 2019-08-31 DIAGNOSIS — K219 Gastro-esophageal reflux disease without esophagitis: Secondary | ICD-10-CM | POA: Diagnosis not present

## 2019-08-31 DIAGNOSIS — J3089 Other allergic rhinitis: Secondary | ICD-10-CM

## 2019-08-31 NOTE — Patient Instructions (Addendum)
  1. Continue Symbicort 160 - 2 inhalations twice a day during periods of lower airway symptoms  2. Continue nasal fluticasone 2 sprays each nostril one time per day during periods of upper airway symptoms  3. Continue OTC famotidine 20mg  twice a day  4. Continue cetirizine 10 mg one tablet one time per day  5. Continue ProAir HFA 2 puffs every 4-6 hours if needed  6. Return to clinic in 6 months or earlier if problem.   7. Obtain fall flu vaccine (and COVID vaccine)

## 2019-08-31 NOTE — Progress Notes (Signed)
[place] [facility] [place]   Follow-up Note  Referring Provider: Verdell Carmine., MD Primary Provider: Verdell Carmine., MD Date of Office Visit: 08/31/2019  Subjective:   Ann Phillips (DOB: 06-11-1951) is a 68 y.o. female who returns to the Allergy and [place] on 08/31/2019 in re-evaluation of the following:  HPI: Latishia returns to this clinic in reevaluation of COPD with a component of asthma, allergic rhinitis, and LPR.  Her last visit to this clinic was 28 February 2019.  During the interval she has done wonderful and has not had any significant problems with her airway and can exert herself without any problem and does not use a short acting bronchodilator.  She does not use her Symbicort at this point in time.  She has not required a systemic steroid or an antibiotic to treat any type of airway issue.  Her nose does get intermittently stuffy and she intermittently uses a nasal steroid.  Her reflux is under excellent control at this point in time while utilizing OTC famotidine 20 mg twice a day.  Allergies as of 08/31/2019      Reactions   Penicillins       Medication List      albuterol 108 (90 Base) MCG/ACT inhaler Commonly known as: ProAir HFA Inhale two puffs every four to six hours as needed for cough or wheeze.   budesonide-formoterol 160-4.5 MCG/ACT inhaler Commonly known as: Symbicort Inhale 2 puffs into the lungs twice daily during periods of lower airway symptoms   calcitRIOL 0.5 MCG capsule Commonly known as: ROCALTROL daily.   Calcium Carbonate-Vitamin D 600-400 MG-UNIT tablet 1,500 mg 2 (two) times daily.   cetirizine 10 MG tablet Commonly known as: ZYRTEC Take 10 mg by mouth daily.   famotidine 40 MG tablet Commonly known as: PEPCID Take 1 tablet by mouth once daily   fluticasone 50 MCG/ACT nasal spray Commonly known as: FLONASE Place 2 sprays into both nostrils daily.   hydrochlorothiazide 25 MG tablet  Commonly known as: HYDRODIURIL   Klor-Con M20 20 MEQ tablet Generic drug: potassium chloride SA   levothyroxine 175 MCG tablet Commonly known as: SYNTHROID       Past Medical History:  Diagnosis Date  . Allergic rhinoconjunctivitis   . Asthma   . Breast cancer ([place])   . GERD (gastroesophageal reflux disease)   . Thyroid cancer [facility])     Past Surgical History:  Procedure Laterality Date  . BRAIN TUMOR EXCISION    . BREAST LUMPECTOMY    . PARTIAL HYSTERECTOMY    . THYROIDECTOMY      Review of systems negative except as noted in HPI / PMHx or noted below:  Review of Systems  Constitutional: Negative.   HENT: Negative.   Eyes: Negative.   Respiratory: Negative.   Cardiovascular: Negative.   Gastrointestinal: Negative.   Genitourinary: Negative.   Musculoskeletal: Negative.   Skin: Negative.   Neurological: Negative.   Endo/Heme/Allergies: Negative.   Psychiatric/Behavioral: Negative.      Objective:   Vitals:   08/31/19 1158  BP: 140/60  Pulse: 76  Resp: 20  SpO2: 96%   Height: 5' 3.9" (162.3 cm)  Weight: 189 lb 6.4 oz (85.9 kg)   Physical Exam Constitutional:      Appearance: She is not diaphoretic.  HENT:     Head: Normocephalic.     Right Ear: Tympanic membrane, ear canal and external ear normal.     Left Ear:  Tympanic membrane, ear canal and external ear normal.     Nose: Nose normal. No mucosal edema or rhinorrhea.     Mouth/Throat:     Pharynx: Uvula midline. No oropharyngeal exudate.  Eyes:     Conjunctiva/sclera: Conjunctivae normal.  Neck:     Thyroid: No thyromegaly.     Trachea: Trachea normal. No tracheal tenderness or tracheal deviation.  Cardiovascular:     Rate and Rhythm: Normal rate and regular rhythm.     Heart sounds: Normal heart sounds, S1 normal and S2 normal. No murmur.  Pulmonary:     Effort: No respiratory distress.     Breath sounds: Normal breath sounds. No stridor. No wheezing or rales.  Lymphadenopathy:      Head:     Right side of head: No tonsillar adenopathy.     Left side of head: No tonsillar adenopathy.     Cervical: No cervical adenopathy.  Skin:    Findings: No erythema or rash.     Nails: There is no clubbing.   Neurological:     Mental Status: She is alert.     Diagnostics:    Spirometry was performed and demonstrated an FEV1 of 1.29 at 56 % of predicted.  Assessment and Plan:   1. COPD with asthma ([place])   2. Other allergic rhinitis   3. Gastroesophageal reflux disease, esophagitis presence not specified     1. Continue Symbicort 160 - 2 inhalations twice a day during periods of lower airway symptoms  2. Continue nasal fluticasone 2 sprays each nostril one time per day during periods of upper airway symptoms  3. Continue OTC famotidine 20mg  twice a day  4. Continue cetirizine 10 mg one tablet one time per day  5. Continue ProAir HFA 2 puffs every 4-6 hours if needed  6. Return to clinic in 6 months or earlier if problem.   7. Obtain fall flu vaccine (and COVID vaccine)   Overall [name] has really done very well and she has a minimal requirement for medications in the treatment of her respiratory tract issue and her reflux issue.  I will see her back in this clinic in 6 months or earlier while she continues on the plan noted above.  [name], MD Allergy / Immunology [place]

## 2019-09-01 ENCOUNTER — Encounter: Payer: Self-pay | Admitting: Allergy and Immunology

## 2020-02-27 ENCOUNTER — Other Ambulatory Visit: Payer: Self-pay

## 2020-02-27 ENCOUNTER — Ambulatory Visit: Payer: Medicare Other | Admitting: Allergy and Immunology

## 2020-02-27 ENCOUNTER — Encounter: Payer: Self-pay | Admitting: Allergy and Immunology

## 2020-02-27 VITALS — BP 144/70 | HR 77 | Temp 98.0°F | Resp 16 | Ht 64.0 in | Wt 192.6 lb

## 2020-02-27 DIAGNOSIS — K219 Gastro-esophageal reflux disease without esophagitis: Secondary | ICD-10-CM

## 2020-02-27 DIAGNOSIS — J3089 Other allergic rhinitis: Secondary | ICD-10-CM | POA: Diagnosis not present

## 2020-02-27 DIAGNOSIS — J449 Chronic obstructive pulmonary disease, unspecified: Secondary | ICD-10-CM | POA: Diagnosis not present

## 2020-02-27 MED ORDER — ALBUTEROL SULFATE HFA 108 (90 BASE) MCG/ACT IN AERS: INHALATION_SPRAY | RESPIRATORY_TRACT | 1 refills | Status: AC

## 2020-02-27 NOTE — Patient Instructions (Addendum)
  1. Continue nasal fluticasone 2 sprays each nostril 3-7 times per week  2. Continue OTC famotidine 20mg  twice a day  3. Continue cetirizine 10 mg one tablet one time per day  4. Continue ProAir HFA 2 puffs every 4-6 hours if needed  5. Can restart Symbicort 160 - 2 inhalations twice a day during periods of lower airway symptoms  6. Return to clinic in 6 months or earlier if problem.   7. Obtain Covid vaccine

## 2020-02-27 NOTE — Progress Notes (Signed)
[place] [facility] [place]   Follow-up Note  Referring Provider: Verdell Carmine., MD Primary Provider: Verdell Carmine., MD Date of Office Visit: 02/27/2020  Subjective:   Ann Phillips (DOB: Apr 14, 1951) is a 69 y.o. female who returns to the Allergy and [place] on 02/27/2020 in re-evaluation of the following:  HPI: Ann Phillips returns to this clinic in evaluation of COPD with component of asthma, allergic rhinitis, and LPR.  Her last visit to this clinic was 31 August 2019.  She has really done very well with her airway issue.  She does not use Symbicort on a regular basis.  She does not have a need to use a short acting bronchodilator and she can exert herself without any problem.  Her nose is doing very well as long as she uses Flonase at least a few times per week.  It does not sound as though she has required a systemic steroid or antibiotic for any type of airway issue.  Her reflux is under very good control at this point in time while utilizing famotidine.  She contracted Covid in mid November 2020 mostly presenting as fatigue with some slight congestion of her airway but no long-term sequela and no therapy required.     Allergies as of 02/27/2020      Reactions   Penicillins       Medication List    albuterol 108 (90 Base) MCG/ACT inhaler Commonly known as: ProAir HFA Inhale two puffs every four to six hours as needed for cough or wheeze.   calcitRIOL 0.5 MCG capsule Commonly known as: ROCALTROL daily.   Calcium Carbonate-Vitamin D 600-400 MG-UNIT tablet 1,500 mg 2 (two) times daily.   cetirizine 10 MG tablet Commonly known as: ZYRTEC Take 10 mg by mouth daily.   famotidine 40 MG tablet Commonly known as: PEPCID Take 1 tablet by mouth once daily   fluticasone 50 MCG/ACT nasal spray Commonly known as: FLONASE Place 2 sprays into both nostrils daily.   hydrochlorothiazide 12.5 MG capsule Commonly known as: MICROZIDE     Klor-Con M20 20 MEQ tablet Generic drug: potassium chloride SA   levothyroxine 150 MCG tablet Commonly known as: SYNTHROID TAKE 1 TABLET BY MOUTH ONCE DAILY AT 0600   VITAMIN C PO Take by mouth.   VITAMIN K PO Take by mouth.       Past Medical History:  Diagnosis Date  . Allergic rhinoconjunctivitis   . Asthma   . Breast cancer ([place])   . GERD (gastroesophageal reflux disease)   . Thyroid cancer [facility])     Past Surgical History:  Procedure Laterality Date  . BRAIN TUMOR EXCISION    . BREAST LUMPECTOMY    . PARTIAL HYSTERECTOMY    . THYROIDECTOMY      Review of systems negative except as noted in HPI / PMHx or noted below:  Review of Systems  Constitutional: Negative.   HENT: Negative.   Eyes: Negative.   Respiratory: Negative.   Cardiovascular: Negative.   Gastrointestinal: Negative.   Genitourinary: Negative.   Musculoskeletal: Negative.   Skin: Negative.   Neurological: Negative.   Endo/Heme/Allergies: Negative.   Psychiatric/Behavioral: Negative.      Objective:   Vitals:   02/27/20 1114  BP: (!) 144/70  Pulse: 77  Resp: 16  Temp: 98 F (36.7 C)  SpO2: 95%   Height: 5\' 4"  (162.6 cm)  Weight: 192 lb 9.6 oz (87.4 kg)   Physical Exam Constitutional:  Appearance: She is not diaphoretic.  HENT:     Head: Normocephalic.     Right Ear: Tympanic membrane, ear canal and external ear normal.     Left Ear: Tympanic membrane, ear canal and external ear normal.     Nose: Nose normal. No mucosal edema or rhinorrhea.     Mouth/Throat:     Pharynx: Uvula midline. No oropharyngeal exudate.  Eyes:     Conjunctiva/sclera: Conjunctivae normal.  Neck:     Thyroid: No thyromegaly.     Trachea: Trachea normal. No tracheal tenderness or tracheal deviation.  Cardiovascular:     Rate and Rhythm: Normal rate and regular rhythm.     Heart sounds: Normal heart sounds, S1 normal and S2 normal. No murmur.  Pulmonary:     Effort: No respiratory distress.      Breath sounds: Normal breath sounds. No stridor. No wheezing or rales.  Lymphadenopathy:     Head:     Right side of head: No tonsillar adenopathy.     Left side of head: No tonsillar adenopathy.     Cervical: No cervical adenopathy.  Skin:    Findings: No erythema or rash.     Nails: There is no clubbing.  Neurological:     Mental Status: She is alert.     Diagnostics:    Spirometry was performed and demonstrated an FEV1 of 0.84 at 37 % of predicted.  Assessment and Plan:   1. COPD with asthma ([place])   2. Other allergic rhinitis   3. LPRD (laryngopharyngeal reflux disease)     1. Continue nasal fluticasone 2 sprays each nostril 3-7 times per week  2. Continue OTC famotidine 20mg  twice a day  3. Continue cetirizine 10 mg one tablet one time per day  4. Continue ProAir HFA 2 puffs every 4-6 hours if needed  5. Can restart Symbicort 160 - 2 inhalations twice a day during periods of lower airway symptoms  6. Return to clinic in 6 months or earlier if problem.   7. Obtain Covid vaccine  Enez is really doing very well at this point in time and I do not see any need for changing her medications.  She has not used her Symbicort in over a year and she is very satisfied with the response she receives regarding her medical plan without the use of Symbicort at this point.  She will continue to use Flonase and continue to use famotidine and, of course, if it should it be required, she can always restart her Symbicort.  I will see her back in this clinic in 6 months or earlier if there is a problem.  [name], MD Allergy / Immunology [place]

## 2020-02-28 ENCOUNTER — Encounter: Payer: Self-pay | Admitting: Allergy and Immunology

## 2020-08-29 ENCOUNTER — Other Ambulatory Visit: Payer: Self-pay

## 2020-08-29 ENCOUNTER — Encounter: Payer: Self-pay | Admitting: Allergy and Immunology

## 2020-08-29 ENCOUNTER — Ambulatory Visit: Payer: Medicare Other | Admitting: Allergy and Immunology

## 2020-08-29 VITALS — BP 144/66 | HR 85 | Resp 16

## 2020-08-29 DIAGNOSIS — K219 Gastro-esophageal reflux disease without esophagitis: Secondary | ICD-10-CM

## 2020-08-29 DIAGNOSIS — J449 Chronic obstructive pulmonary disease, unspecified: Secondary | ICD-10-CM | POA: Diagnosis not present

## 2020-08-29 DIAGNOSIS — J3089 Other allergic rhinitis: Secondary | ICD-10-CM | POA: Diagnosis not present

## 2020-08-29 NOTE — Progress Notes (Signed)
[place] [facility] [place]   Follow-up Note  Referring Provider: Verdell Carmine., MD Primary Provider: Verdell Carmine., MD Date of Office Visit: 08/29/2020  Subjective:   Ann Phillips (DOB: 1951/05/30) is a 69 y.o. female who returns to the [facility] and [place] on 08/29/2020 in re-evaluation of the following:  HPI: Ann Phillips returns to this clinic in evaluation of COPD with component of asthma, history of allergic rhinitis, and history of LPR.  Her last visit to this clinic was 02/27/2020.  She has had an excellent interval of time and has not had any significant issues involving her airway and has not required a systemic steroid or an antibiotic for any type of airway issue and rarely uses a short acting bronchodilator and can exercise without any problem.  She continues to use nasal fluticasone a few times per week and has not had to restart her Symbicort.  Her reflux is under excellent control utilizing famotidine.  She will not be receiving the Covid vaccine.  Allergies as of 08/29/2020      Reactions   Penicillins       Medication List    albuterol 108 (90 Base) MCG/ACT inhaler Commonly known as: ProAir HFA Inhale two puffs every four to six hours as needed for cough or wheeze.   calcitRIOL 0.5 MCG capsule Commonly known as: ROCALTROL daily.   Calcium Carbonate-Vitamin D 600-400 MG-UNIT tablet 1,500 mg 2 (two) times daily.   cetirizine 10 MG tablet Commonly known as: ZYRTEC Take 10 mg by mouth daily.   famotidine 40 MG tablet Commonly known as: PEPCID Take 1 tablet by mouth once daily   fluticasone 50 MCG/ACT nasal spray Commonly known as: FLONASE Place 2 sprays into both nostrils daily.   hydrochlorothiazide 12.5 MG capsule Commonly known as: MICROZIDE   Klor-Con M20 20 MEQ tablet Generic drug: potassium chloride SA   levothyroxine 150 MCG tablet Commonly known as: SYNTHROID TAKE 1 TABLET BY MOUTH ONCE DAILY AT  0600   VITAMIN B-12 PO Take by mouth.   VITAMIN C PO Take by mouth.       Past Medical History:  Diagnosis Date  . Allergic rhinoconjunctivitis   . Asthma   . Breast cancer ([place])   . GERD (gastroesophageal reflux disease)   . Thyroid cancer [facility])     Past Surgical History:  Procedure Laterality Date  . BRAIN TUMOR EXCISION    . BREAST LUMPECTOMY    . PARTIAL HYSTERECTOMY    . THYROIDECTOMY      Review of systems negative except as noted in HPI / PMHx or noted below:  Review of Systems  Constitutional: Negative.   HENT: Negative.   Eyes: Negative.   Respiratory: Negative.   Cardiovascular: Negative.   Gastrointestinal: Negative.   Genitourinary: Negative.   Musculoskeletal: Negative.   Skin: Negative.   Neurological: Negative.   Endo/Heme/Allergies: Negative.   Psychiatric/Behavioral: Negative.      Objective:   Vitals:   08/29/20 1116  BP: (!) 144/66  Pulse: 85  Resp: 16  SpO2: 95%          Physical Exam Constitutional:      Appearance: She is not diaphoretic.  HENT:     Head: Normocephalic.     Right Ear: Tympanic membrane, ear canal and external ear normal.     Left Ear: Tympanic membrane, ear canal and external ear normal.     Nose: Nose normal. No mucosal edema or  rhinorrhea.     Mouth/Throat:     Pharynx: Uvula midline. No oropharyngeal exudate.  Eyes:     Conjunctiva/sclera: Conjunctivae normal.  Neck:     Thyroid: No thyromegaly.     Trachea: Trachea normal. No tracheal tenderness or tracheal deviation.  Cardiovascular:     Rate and Rhythm: Normal rate and regular rhythm.     Heart sounds: Normal heart sounds, S1 normal and S2 normal. No murmur heard.   Pulmonary:     Effort: No respiratory distress.     Breath sounds: Normal breath sounds. No stridor. No wheezing or rales.  Lymphadenopathy:     Head:     Right side of head: No tonsillar adenopathy.     Left side of head: No tonsillar adenopathy.     Cervical: No cervical  adenopathy.  Skin:    Findings: No erythema or rash.     Nails: There is no clubbing.  Neurological:     Mental Status: She is alert.     Diagnostics:    Spirometry was performed and demonstrated an FEV1 of 0.87 at 38 % of predicted.  Assessment and Plan:   1. COPD with asthma ([place])   2. Other allergic rhinitis   3. LPRD (laryngopharyngeal reflux disease)     1. Continue nasal fluticasone 1-2 sprays each nostril 3-7 times per week  2. Continue OTC famotidine 20mg  twice a day  3. Continue cetirizine 10 mg one tablet one time per day  4. Continue ProAir HFA 2 puffs every 4-6 hours if needed  5. Can restart Symbicort 160 - 2 inhalations twice a day during periods of lower airway symptoms  6. Return to clinic in 6 months or earlier if problem.   7.  Recommend obtaining Covid vaccine and flu vaccine  Ann Phillips has really done very well on her current therapy.  Although her spirometry suggests very significant COPD her symptomatic limitation is minimal.  She rarely uses a short acting bronchodilator and she can exercise without any difficulty and has not utilized Symbicort in a year.  Her nose is doing well on her current therapy as is her reflux.  She will continue on this plan and we will see her back in this clinic in 6 months or earlier if there is a problem.  [name], MD Allergy / Immunology [place]

## 2020-08-29 NOTE — Patient Instructions (Addendum)
  1. Continue nasal fluticasone 1-2 sprays each nostril 3-7 times per week  2. Continue OTC famotidine 20mg  twice a day  3. Continue cetirizine 10 mg one tablet one time per day  4. Continue ProAir HFA 2 puffs every 4-6 hours if needed  5. Can restart Symbicort 160 - 2 inhalations twice a day during periods of lower airway symptoms  6. Return to clinic in 6 months or earlier if problem.   7.  Recommend obtaining Covid vaccine and flu vaccine

## 2020-08-30 ENCOUNTER — Encounter: Payer: Self-pay | Admitting: Allergy and Immunology

## 2021-02-25 ENCOUNTER — Ambulatory Visit: Payer: Medicare Other | Admitting: Allergy and Immunology

## 2021-02-25 ENCOUNTER — Other Ambulatory Visit: Payer: Self-pay

## 2021-02-25 ENCOUNTER — Encounter: Payer: Self-pay | Admitting: Allergy and Immunology

## 2021-02-25 VITALS — BP 144/62 | HR 68 | Resp 14

## 2021-02-25 DIAGNOSIS — J449 Chronic obstructive pulmonary disease, unspecified: Secondary | ICD-10-CM

## 2021-02-25 DIAGNOSIS — K219 Gastro-esophageal reflux disease without esophagitis: Secondary | ICD-10-CM

## 2021-02-25 DIAGNOSIS — J3089 Other allergic rhinitis: Secondary | ICD-10-CM | POA: Diagnosis not present

## 2021-02-25 MED ORDER — FLUTICASONE PROPIONATE 50 MCG/ACT NA SUSP: NASAL | 1 refills | Status: AC

## 2021-02-25 NOTE — Patient Instructions (Addendum)
  1. Continue nasal fluticasone 1-2 sprays each nostril 3-7 times per week  2. Continue OTC famotidine 20mg  twice a day  3. Continue cetirizine 10 mg one tablet one time per day  4. Continue ProAir HFA 2 puffs every 4-6 hours if needed  5. Can restart Symbicort 160 - 2 inhalations twice a day during periods of lower airway symptoms  6. Return to clinic in 6 months or earlier if problem.

## 2021-02-25 NOTE — Progress Notes (Signed)
[place] [facility] [place]   Follow-up Note  Referring Provider: Verdell Carmine., MD Primary Provider: Verdell Carmine., MD Date of Office Visit: 02/25/2021  Subjective:   Ann Phillips (DOB: 11/10/1951) is a 70 y.o. female who returns to the Allergy and [place] on 02/25/2021 in re-evaluation of the following:  HPI: Ann Phillips returns to this clinic in reevaluation of COPD with asthma, allergic rhinitis, and LPR.  Her last visit to this clinic was 29 August 2020.  She has really done well since her last visit.  She has not required a systemic steroid or an antibiotic for any type of airway issue.  She rarely uses a short acting bronchodilator.  She has not restarted her Symbicort.  Her nasal issue is going well with intermittent use of a nasal steroid.  Her reflux is under control with famotidine.  She will not be receiving a COVID vaccine or flu vaccine.  Allergies as of 02/25/2021      Reactions   Penicillins       Medication List      albuterol 108 (90 Base) MCG/ACT inhaler Commonly known as: ProAir HFA Inhale two puffs every four to six hours as needed for cough or wheeze.   calcitRIOL 0.5 MCG capsule Commonly known as: ROCALTROL daily.   Calcium Carbonate-Vitamin D 600-400 MG-UNIT tablet 1,500 mg 2 (two) times daily.   cetirizine 10 MG tablet Commonly known as: ZYRTEC Take 10 mg by mouth daily.   famotidine 20 MG tablet Commonly known as: PEPCID Take 20 mg by mouth 2 (two) times daily.   fluticasone 50 MCG/ACT nasal spray Commonly known as: FLONASE Place 2 sprays into both nostrils daily.   hydrochlorothiazide 25 MG tablet Commonly known as: HYDRODIURIL Take 25 mg by mouth daily.   Klor-Con M20 20 MEQ tablet Generic drug: potassium chloride SA   levothyroxine 150 MCG tablet Commonly known as: SYNTHROID TAKE 1 TABLET BY MOUTH ONCE DAILY AT 0600   metoprolol succinate 25 MG 24 hr tablet Commonly known as:  TOPROL-XL Take 25 mg by mouth at bedtime.   VITAMIN B-12 PO Take by mouth.   VITAMIN C PO Take by mouth.       Past Medical History:  Diagnosis Date  . Allergic rhinoconjunctivitis   . Asthma   . Breast cancer ([place])   . GERD (gastroesophageal reflux disease)   . Thyroid cancer [facility])     Past Surgical History:  Procedure Laterality Date  . BRAIN TUMOR EXCISION    . BREAST LUMPECTOMY    . PARTIAL HYSTERECTOMY    . THYROIDECTOMY      Review of systems negative except as noted in HPI / PMHx or noted below:  Review of Systems  Constitutional: Negative.   HENT: Negative.   Eyes: Negative.   Respiratory: Negative.   Cardiovascular: Negative.   Gastrointestinal: Negative.   Genitourinary: Negative.   Musculoskeletal: Negative.   Skin: Negative.   Neurological: Negative.   Endo/Heme/Allergies: Negative.   Psychiatric/Behavioral: Negative.      Objective:   Vitals:   02/25/21 1110  BP: (!) 144/62  Pulse: 68  Resp: 14  SpO2: 95%          Physical Exam Constitutional:      Appearance: She is not diaphoretic.  HENT:     Head: Normocephalic.     Right Ear: Tympanic membrane, ear canal and external ear normal.     Left Ear: Tympanic membrane, ear  canal and external ear normal.     Nose: Nose normal. No mucosal edema or rhinorrhea.     Mouth/Throat:     Pharynx: Uvula midline. No oropharyngeal exudate.  Eyes:     Conjunctiva/sclera: Conjunctivae normal.  Neck:     Thyroid: No thyromegaly.     Trachea: Trachea normal. No tracheal tenderness or tracheal deviation.  Cardiovascular:     Rate and Rhythm: Normal rate and regular rhythm.     Heart sounds: Normal heart sounds, S1 normal and S2 normal. No murmur heard.   Pulmonary:     Effort: No respiratory distress.     Breath sounds: Normal breath sounds. No stridor. No wheezing or rales.  Lymphadenopathy:     Head:     Right side of head: No tonsillar adenopathy.     Left side of head: No tonsillar  adenopathy.     Cervical: No cervical adenopathy.  Skin:    Findings: No erythema or rash.     Nails: There is no clubbing.  Neurological:     Mental Status: She is alert.     Diagnostics:    Spirometry was performed and demonstrated an FEV1 of 1.07 at 48 % of predicted.  Assessment and Plan:   1. COPD with asthma ([place])   2. Other allergic rhinitis   3. LPRD (laryngopharyngeal reflux disease)     1. Continue nasal fluticasone 1-2 sprays each nostril 3-7 times per week  2. Continue OTC famotidine 20mg  twice a day  3. Continue cetirizine 10 mg one tablet one time per day  4. Continue ProAir HFA 2 puffs every 4-6 hours if needed  5. Can restart Symbicort 160 - 2 inhalations twice a day during periods of lower airway symptoms  6. Return to clinic in 6 months or earlier if problem.   Ann Phillips appears to be doing okay on her current plan which includes minimal amounts of medications directed against respiratory tract inflammation.  She has the option of using nasal fluticasone and the option of restarting her Symbicort should she feel that these are necessary as she moves forward.  And she can continue to treat her reflux with the therapy noted above.  I will see her back in this clinic in 6 months or earlier if there is a problem.  Ann Katz, MD Allergy / Immunology [place]

## 2021-02-26 ENCOUNTER — Encounter: Payer: Self-pay | Admitting: Allergy and Immunology

## 2021-08-28 ENCOUNTER — Ambulatory Visit (INDEPENDENT_AMBULATORY_CARE_PROVIDER_SITE_OTHER): Payer: Medicare Other | Admitting: Allergy and Immunology

## 2021-08-28 ENCOUNTER — Encounter: Payer: Self-pay | Admitting: Allergy and Immunology

## 2021-08-28 ENCOUNTER — Other Ambulatory Visit: Payer: Self-pay

## 2021-08-28 VITALS — BP 118/68 | HR 75 | Resp 20

## 2021-08-28 DIAGNOSIS — K219 Gastro-esophageal reflux disease without esophagitis: Secondary | ICD-10-CM | POA: Diagnosis not present

## 2021-08-28 DIAGNOSIS — J449 Chronic obstructive pulmonary disease, unspecified: Secondary | ICD-10-CM | POA: Diagnosis not present

## 2021-08-28 DIAGNOSIS — J3089 Other allergic rhinitis: Secondary | ICD-10-CM | POA: Diagnosis not present

## 2021-08-28 NOTE — Progress Notes (Signed)
[place]   Follow-up Note  Referring Provider: Verdell Carmine., MD Primary Provider: Verdell Carmine., MD Date of Office Visit: 08/28/2021  Subjective:   Ann Phillips (DOB: December 31, 1950) is a 70 y.o. female who returns to the Allergy and [place] on 08/28/2021 in re-evaluation of the following:  HPI: Ann Phillips returns to this clinic in reevaluation of COPD with asthma, allergic rhinitis, and LPR.  Her last visit to this clinic was 25 February 2021.  Overall she has really done well with her airway and has not required a systemic steroid or an antibiotic for any type of airway issue and rarely uses a short acting bronchodilator and can exert herself to the extent that she so desires with no problem.  She has had very little problems with her nose although sometimes she gets a little bit of nasal congestion.  She continues to use nasal fluticasone just a few times per week.  Her reflux is under excellent control while using famotidine twice a day.  She has been diagnosed with diabetes with a hemoglobin A1c around 7 then she lost 21 pounds of weight and her hemoglobin A1c checked a few weeks ago was 5.8.  Allergies as of 08/28/2021       Reactions   Penicillins         Medication List    albuterol 108 (90 Base) MCG/ACT inhaler Commonly known as: ProAir HFA Inhale two puffs every four to six hours as needed for cough or wheeze.   atorvastatin 20 MG tablet Commonly known as: LIPITOR Take 20 mg by mouth daily.   calcitRIOL 0.5 MCG capsule Commonly known as: ROCALTROL daily.   Calcium Carbonate-Vitamin D 600-400 MG-UNIT tablet 1,500 mg 2 (two) times daily.   cetirizine 10 MG tablet Commonly known as: ZYRTEC Take 10 mg by mouth daily.   famotidine 20 MG tablet Commonly known as: PEPCID Take 20 mg by mouth 2 (two) times daily.   fluticasone 50 MCG/ACT nasal spray Commonly known as: FLONASE Use one to two sprays in each  nostril three to seven times per week as directed.   hydrochlorothiazide 25 MG tablet Commonly known as: HYDRODIURIL Take 25 mg by mouth daily.   Klor-Con M20 20 MEQ tablet Generic drug: potassium chloride SA   levothyroxine 150 MCG tablet Commonly known as: SYNTHROID TAKE 1 TABLET BY MOUTH ONCE DAILY AT 0600   metoprolol succinate 25 MG 24 hr tablet Commonly known as: TOPROL-XL Take 25 mg by mouth at bedtime.   VITAMIN B-12 PO Take by mouth.   VITAMIN C PO Take by mouth.    Past Medical History:  Diagnosis Date   Allergic rhinoconjunctivitis    Asthma    Breast cancer ([place])    Diabetes ([place])    GERD (gastroesophageal reflux disease)    Thyroid cancer ([place])     Past Surgical History:  Procedure Laterality Date   BRAIN TUMOR EXCISION     BREAST LUMPECTOMY     PARTIAL HYSTERECTOMY     THYROIDECTOMY      Review of systems negative except as noted in HPI / PMHx or noted below:  Review of Systems  Constitutional: Negative.   HENT: Negative.    Eyes: Negative.   Respiratory: Negative.    Cardiovascular: Negative.   Gastrointestinal: Negative.   Genitourinary: Negative.   Musculoskeletal: Negative.   Skin: Negative.   Neurological: Negative.   Endo/Heme/Allergies: Negative.   Psychiatric/Behavioral: Negative.  Objective:   Vitals:   08/28/21 1101  BP: 118/68  Pulse: 75  Resp: 20  SpO2: 98%          Physical Exam Constitutional:      Appearance: She is not diaphoretic.  HENT:     Head: Normocephalic.     Right Ear: Tympanic membrane, ear canal and external ear normal.     Left Ear: Tympanic membrane, ear canal and external ear normal.     Nose: Nose normal. No mucosal edema or rhinorrhea.     Mouth/Throat:     Pharynx: Uvula midline. No oropharyngeal exudate.  Eyes:     Conjunctiva/sclera: Conjunctivae normal.  Neck:     Thyroid: No thyromegaly.     Trachea: Trachea normal. No tracheal tenderness or tracheal deviation.  Cardiovascular:      Rate and Rhythm: Normal rate and regular rhythm.     Heart sounds: Normal heart sounds, S1 normal and S2 normal. No murmur heard. Pulmonary:     Effort: No respiratory distress.     Breath sounds: Normal breath sounds. No stridor. No wheezing or rales.  Lymphadenopathy:     Head:     Right side of head: No tonsillar adenopathy.     Left side of head: No tonsillar adenopathy.     Cervical: No cervical adenopathy.  Skin:    Findings: No erythema or rash.     Nails: There is no clubbing.  Neurological:     Mental Status: She is alert.    Diagnostics:    Spirometry was performed and demonstrated an FEV1 of 0.85 at 40 % of predicted.  Assessment and Plan:   1. COPD with asthma ([place])   2. Other allergic rhinitis   3. LPRD (laryngopharyngeal reflux disease)     1. Continue nasal fluticasone 1-2 sprays each nostril 3-7 times per week  2. Continue OTC famotidine '20mg'$  twice a day  3. Continue cetirizine 10 mg one tablet one time per day  4. Continue ProAir HFA 2 puffs every 4-6 hours if needed  5. Can restart Symbicort 160 - 2 inhalations twice a day during periods of lower airway symptoms  6. Return to clinic in 12 months or earlier if problem.   7. Obtain fall flu vaccine  Overall Ann Phillips appears to be doing quite well and I would like to have her continue to use nasal fluticasone a few times per week along with her famotidine on a consistent basis to address her respiratory inflammation and reflux induced respiratory disease.  She can always add in Symbicort should it be required in the future.  I am going to see her back in his clinic in 1 year or earlier if there is a problem.   [name], MD Allergy / Immunology [place]

## 2021-08-28 NOTE — Patient Instructions (Signed)
  1. Continue nasal fluticasone 1-2 sprays each nostril 3-7 times per week  2. Continue OTC famotidine '20mg'$  twice a day  3. Continue cetirizine 10 mg one tablet one time per day  4. Continue ProAir HFA 2 puffs every 4-6 hours if needed  5. Can restart Symbicort 160 - 2 inhalations twice a day during periods of lower airway symptoms  6. Return to clinic in 12 months or earlier if problem.   7. Obtain fall flu vaccine

## 2021-08-29 ENCOUNTER — Encounter: Payer: Self-pay | Admitting: Allergy and Immunology

## 2022-08-28 ENCOUNTER — Ambulatory Visit: Payer: Medicare Other | Admitting: Allergy and Immunology
